# Patient Record
Sex: Male | Born: 2000 | Race: Black or African American | Hispanic: No | Marital: Single | Smoking: Never smoker
Health system: Southern US, Community
[De-identification: ages and names within clinical notes are randomized; demographics above are authoritative.]

## PROBLEM LIST (undated history)

## (undated) HISTORY — PX: ADENOIDECTOMY: SUR15

---
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
## 2020-01-16 ENCOUNTER — Emergency Department (HOSPITAL_COMMUNITY)
Admission: EM | Admit: 2020-01-16 | Discharge: 2020-01-16 | Disposition: A | Discharge status: Home / Self Care | Payer: PRIVATE HEALTH INSURANCE | Attending: Emergency Medicine | Admitting: Emergency Medicine

## 2020-01-16 ENCOUNTER — Other Ambulatory Visit: Payer: Self-pay

## 2020-01-16 ENCOUNTER — Encounter (HOSPITAL_COMMUNITY): Payer: Self-pay | Admitting: Emergency Medicine

## 2020-01-16 DIAGNOSIS — A64 Unspecified sexually transmitted disease: Secondary | ICD-10-CM | POA: Insufficient documentation

## 2020-01-16 DIAGNOSIS — Z7251 High risk heterosexual behavior: Secondary | ICD-10-CM

## 2020-01-16 DIAGNOSIS — R369 Urethral discharge, unspecified: Secondary | ICD-10-CM | POA: Diagnosis present

## 2020-01-16 LAB — URINALYSIS, ROUTINE W REFLEX MICROSCOPIC
Bacteria, UA: NONE SEEN
Bilirubin Urine: NEGATIVE
Glucose, UA: NEGATIVE mg/dL
Hgb urine dipstick: NEGATIVE
Ketones, ur: NEGATIVE mg/dL
Nitrite: NEGATIVE
Protein, ur: 30 mg/dL — AB
Specific Gravity, Urine: 1.028 (ref 1.005–1.030)
WBC, UA: >50 WBC/hpf — ABNORMAL HIGH (ref 0–5)
pH: 6 (ref 5.0–8.0)

## 2020-01-16 LAB — HIV ANTIBODY (ROUTINE TESTING W REFLEX): HIV Screen 4th Generation wRfx: NONREACTIVE

## 2020-01-16 MED ORDER — AZITHROMYCIN 250 MG PO TABS
2000.0000 mg | ORAL_TABLET | Freq: Once | ORAL | Status: AC
  Administered 2020-01-16: 2000 mg via ORAL
  Filled 2020-01-16: qty 8

## 2020-01-16 MED ORDER — GENTAMICIN SULFATE 40 MG/ML IJ SOLN
240.0000 mg | Freq: Once | INTRAMUSCULAR | Status: AC
  Administered 2020-01-16: 240 mg via INTRAMUSCULAR
  Filled 2020-01-16: qty 6

## 2020-01-16 NOTE — Discharge Instructions (Addendum)
Avoid sex for the next 1 week or until symptoms resolve.  Partners need to be treated.

## 2020-01-16 NOTE — ED Provider Notes (Addendum)
[facility] EMERGENCY DEPARTMENT Provider Note   CSN: [id number] Arrival date & time: 01/16/20  1106     History Chief Complaint  Patient presents with  . Penile Discharge    Steven Stanley is a 19 y.o. male.  Patient is an 19 year old male with no significant past medical history presenting today with penile discharge for the last 2 to 3 days. Patient reports that he has some mild dysuria and a yellow discharge. He has not noticed any swelling of his penis or lesions. He has no testicular pain. Patient reports he has been sexually active with 2 partners in the last 1 month and only use protection with one of them. He is unaware if the other partner is having any symptoms. Patient was treated for concern for GC and chlamydia in June of this year with similar symptoms. However his GC chlamydia swab never resulted. Patient does have a significant allergy to cephalosporins and in his last visit received gentamicin and azithromycin. He reports he did very well with this antibiotic regime and had no side effects.  The history is provided by the patient.  Penile Discharge       History reviewed. No pertinent past medical history.  There are no problems to display for this patient.   Past Surgical History:  Procedure Laterality Date  . ADENOIDECTOMY         No family history on file.  Social History   Tobacco Use  . Smoking status: Never Smoker  . Smokeless tobacco: Never Used  Substance Use Topics  . Alcohol use: No  . Drug use: No    Home Medications Prior to Admission medications   Medication Sig Start Date End Date Taking? Authorizing Provider  acetaminophen (TYLENOL) 500 MG tablet Take 500 mg by mouth every 6 (six) hours as needed for fever.    [provider]  azithromycin (ZITHROMAX Z-PAK) 250 MG tablet Take 2 pills today, then 1 pill daily until gone. 08/17/15   Charm Rings, MD    Allergies    Cefdinir and Omni-pac  Review of  Systems   Review of Systems  Genitourinary: Positive for discharge.  All other systems reviewed and are negative.   Physical Exam Updated Vital Signs BP 128/81   Pulse 69   Temp 98.3 F (36.8 C) (Oral)   Resp 17   Ht 6\' 3"  (1.905 m)   Wt 88.5 kg   SpO2 100%   BMI 24.37 kg/m   Physical Exam Vitals and nursing note reviewed.  Constitutional:      Appearance: Normal appearance. He is normal weight.  HENT:     Head: Normocephalic.  Eyes:     Pupils: Pupils are equal, round, and reactive to light.  Cardiovascular:     Rate and Rhythm: Normal rate.     Pulses: Normal pulses.  Pulmonary:     Effort: Pulmonary effort is normal.  Abdominal:     General: Abdomen is flat. There is no distension.     Palpations: Abdomen is soft.     Tenderness: There is no abdominal tenderness. There is no guarding or rebound.  Genitourinary:    Penis: Circumcised. Discharge present. No tenderness, swelling or lesions.      Testes: Normal.     Epididymis:     Right: Normal.     Left: Normal.  Musculoskeletal:        General: Normal range of motion.  Lymphadenopathy:     Lower Body: No  right inguinal adenopathy. No left inguinal adenopathy.  Skin:    General: Skin is warm and dry.     Capillary Refill: Capillary refill takes less than 2 seconds.  Neurological:     General: No focal deficit present.     Mental Status: He is alert and oriented to person, place, and time. Mental status is at baseline.  Psychiatric:        Mood and Affect: Mood normal.        Behavior: Behavior normal.        Thought Content: Thought content normal.      ED Results / Procedures / Treatments   Labs (all labs ordered are listed, but only abnormal results are displayed) Labs Reviewed  URINALYSIS, ROUTINE W REFLEX MICROSCOPIC - Abnormal; Notable for the following components:      Result Value   Color, Urine AMBER (*)    APPearance HAZY (*)    Protein, ur 30 (*)    Leukocytes,Ua SMALL (*)    WBC, UA  >50 (*)    All other components within normal limits  RPR  HIV ANTIBODY (ROUTINE TESTING W REFLEX)  GC/CHLAMYDIA PROBE AMP ([place]) NOT AT [facility]    EKG None  Radiology No results found.  Procedures Procedures (including critical care time)  Medications Ordered in ED Medications  gentamicin (GARAMYCIN) injection 240 mg (has no administration in time range)  azithromycin (ZITHROMAX) tablet 2,000 mg (has no administration in time range)    ED Course  I have reviewed the triage vital signs and the nursing notes.  Pertinent labs & imaging results that were available during my care of the patient were reviewed by me and considered in my medical decision making (see chart for details).    MDM Rules/Calculators/A&P                          Patient presenting with symptoms most consistent with STI for risky sexual practices. Patient is having penile discharge but no other symptoms. There are no lesions concerning for syphilis at this time. No testicular involvement. UA and GC chlamydia ordered. Patient does have a significant penicillin and cephalosporin allergy and in the past when he has been treated he received gentamicin and azithromycin and reports that he did very well with this. We will plan on giving a dose today and checking for HIV and syphilis as well. Otherwise patient is well-appearing and stable for discharge.  12:49 PM UA consistent with uretritis  MDM Number of Diagnoses or Management Options   Amount and/or Complexity of Data Reviewed Clinical lab tests: ordered and reviewed Decide to obtain previous medical records or to obtain history from someone other than the patient: yes Obtain history from someone other than the patient: no Review and summarize past medical records: yes Independent visualization of images, tracings, or specimens: yes  Risk of Complications, Morbidity, and/or Mortality Presenting problems: low Diagnostic procedures: low Management  options: low  Patient Progress Patient progress: stable  Final Clinical Impression(s) / ED Diagnoses Final diagnoses:  Penile discharge  STD (male)  Unprotected sex    Rx / DC Orders ED Discharge Orders    None       Gwyneth Sprout, MD 01/16/20 1149    Gwyneth Sprout, MD 01/16/20 1249

## 2020-01-16 NOTE — ED Triage Notes (Signed)
Pt reports penile discharge x 2 days. Denies pain 

## 2020-01-17 LAB — RPR: RPR Ser Ql: NONREACTIVE

## 2020-03-30 ENCOUNTER — Ambulatory Visit
Admission: EM | Admit: 2020-03-30 | Discharge: 2020-03-30 | Disposition: A | Discharge status: Home / Self Care | Payer: BC Managed Care – PPO | Attending: Emergency Medicine | Admitting: Emergency Medicine

## 2020-03-30 ENCOUNTER — Other Ambulatory Visit: Payer: Self-pay

## 2020-03-30 ENCOUNTER — Encounter: Payer: Self-pay | Admitting: Emergency Medicine

## 2020-03-30 DIAGNOSIS — Z1152 Encounter for screening for COVID-19: Secondary | ICD-10-CM

## 2020-03-30 DIAGNOSIS — B349 Viral infection, unspecified: Secondary | ICD-10-CM | POA: Diagnosis not present

## 2020-03-30 MED ORDER — FLUTICASONE PROPIONATE 50 MCG/ACT NA SUSP: 1.0000 | Freq: Every day | NASAL | 0 refills | Status: AC

## 2020-03-30 NOTE — Discharge Instructions (Signed)
COVID test pending Rest and fluids Flonase for sneezing, congestion Follow up if not improving or worsening

## 2020-03-30 NOTE — ED Triage Notes (Signed)
Pt said x 2days he has been having SOB with no taste or smell. Pt said he was congested but has gone. No fevers, no chills. O2 IS 98%

## 2020-04-01 LAB — NOVEL CORONAVIRUS, NAA: SARS-CoV-2, NAA: NOT DETECTED

## 2020-04-01 LAB — SARS-COV-2, NAA 2 DAY TAT

## 2020-04-02 NOTE — ED Provider Notes (Signed)
[facility]    CSN: [id number] Arrival date & time: 03/30/20  1700      History   Chief Complaint Chief Complaint  Patient presents with  . Shortness of Breath    HPI Steven Stanley is a 20 y.o. male presenting today for evaluation of loss of smell/taste and fatigue.  Reports that symptoms began approximately 2 days ago.  He denies any significant URI symptoms of cough congestion or sore throat.  Has felt slightly short of breath, but this gripes episodes getting winded easily.  Denies any chest discomfort or cough.  HPI  History reviewed. No pertinent past medical history.  There are no problems to display for this patient.   Past Surgical History:  Procedure Laterality Date  . ADENOIDECTOMY         Home Medications    Prior to Admission medications   Medication Sig Start Date End Date Taking? Authorizing Provider  fluticasone (FLONASE) 50 MCG/ACT nasal spray Place 1-2 sprays into both nostrils daily. 03/30/20  Yes Modestine Scherzinger C, PA-C  acetaminophen (TYLENOL) 500 MG tablet Take 500 mg by mouth every 6 (six) hours as needed for fever.    [provider]  azithromycin (ZITHROMAX Z-PAK) 250 MG tablet Take 2 pills today, then 1 pill daily until gone. 08/17/15   Charm Rings, MD    Family History History reviewed. No pertinent family history.  Social History Social History   Tobacco Use  . Smoking status: Never Smoker  . Smokeless tobacco: Never Used  Substance Use Topics  . Alcohol use: No  . Drug use: No     Allergies   Cefdinir and Omni-pac   Review of Systems Review of Systems  Constitutional: Negative for activity change, appetite change, chills, fatigue and fever.  HENT: Positive for congestion, rhinorrhea, sinus pressure and sore throat. Negative for ear pain and trouble swallowing.   Eyes: Negative for discharge and redness.  Respiratory: Positive for cough. Negative for chest tightness and shortness of breath.    Cardiovascular: Negative for chest pain.  Gastrointestinal: Negative for abdominal pain, diarrhea, nausea and vomiting.  Musculoskeletal: Negative for myalgias.  Skin: Negative for rash.  Neurological: Negative for dizziness, light-headedness and headaches.     Physical Exam Triage Vital Signs ED Triage Vitals  Enc Vitals Group     BP 03/30/20 1817 132/77     Pulse Rate 03/30/20 1817 62     Resp 03/30/20 1817 16     Temp 03/30/20 1817 97.6 F (36.4 C)     Temp Source 03/30/20 1817 Oral     SpO2 03/30/20 1817 98 %     Weight 03/30/20 1816 185 lb (83.9 kg)     Height 03/30/20 1816 6\' 3"  (1.905 m)     Head Circumference --      Peak Flow --      Pain Score 03/30/20 1816 0     Pain Loc --      Pain Edu? --      Excl. in GC? --    No data found.  Updated Vital Signs BP 132/77 (BP Location: Right Arm)   Pulse 62   Temp 97.6 F (36.4 C) (Oral)   Resp 16   Ht 6\' 3"  (1.905 m)   Wt 185 lb (83.9 kg)   SpO2 98%   BMI 23.12 kg/m   Visual Acuity Right Eye Distance:   Left Eye Distance:   Bilateral Distance:    Right Eye Near:  Left Eye Near:    Bilateral Near:     Physical Exam Vitals and nursing note reviewed.  Constitutional:      Appearance: He is well-developed and well-nourished.     Comments: No acute distress  HENT:     Head: Normocephalic and atraumatic.     Ears:     Comments: Bilateral ears without tenderness to palpation of external auricle, tragus and mastoid, EAC's without erythema or swelling, TM's with good bony landmarks and cone of light. Non erythematous.     Nose: Nose normal.     Mouth/Throat:     Comments: Oral mucosa pink and moist, no tonsillar enlargement or exudate. Posterior pharynx patent and nonerythematous, no uvula deviation or swelling. Normal phonation.'  Eyes:     Conjunctiva/sclera: Conjunctivae normal.  Cardiovascular:     Rate and Rhythm: Normal rate.  Pulmonary:     Effort: Pulmonary effort is normal. No respiratory  distress.     Comments: Breathing comfortably at rest, CTABL, no wheezing, rales or other adventitious sounds auscultated  Abdominal:     General: There is no distension.  Musculoskeletal:        General: Normal range of motion.     Cervical back: Neck supple.  Skin:    General: Skin is warm and dry.  Neurological:     Mental Status: He is alert and oriented to person, place, and time.  Psychiatric:        Mood and Affect: Mood and affect normal.      UC Treatments / Results  Labs (all labs ordered are listed, but only abnormal results are displayed) Labs Reviewed  NOVEL CORONAVIRUS, NAA   Narrative:    Performed at:  [address] [place], [place]  [id number] Lab Director: Jolene Schimke MD, Phone:  [phone]  SARS-COV-2, NAA 2 DAY TAT   Narrative:    Performed at:  [address]  [address] [place], [place]  [id number] Lab Director: Jolene Schimke MD, Phone:  [phone]    EKG   Radiology No results found.  Procedures Procedures (including critical care time)  Medications Ordered in UC Medications - No data to display  Initial Impression / Assessment and Plan / UC Course  I have reviewed the triage vital signs and the nursing notes.  Pertinent labs & imaging results that were available during my care of the patient were reviewed by me and considered in my medical decision making (see chart for details).     Covid test pending, exam reassuring, suspect viral etiology recommend symptomatic and supportive care rest and fluids.  Discussed strict return precautions. Patient verbalized understanding and is agreeable with plan.  Final Clinical Impressions(s) / UC Diagnoses   Final diagnoses:  Encounter for screening for COVID-19  Viral illness     Discharge Instructions     COVID test pending Rest and fluids Flonase for sneezing, congestion Follow up if not improving or worsening   ED Prescriptions     Medication Sig Dispense Auth. Provider   fluticasone (FLONASE) 50 MCG/ACT nasal spray Place 1-2 sprays into both nostrils daily. 16 g Havannah Streat, [place] C, PA-C     PDMP not reviewed this encounter.   Lew Dawes, [place] 04/02/20 2232

## 2020-10-13 IMAGING — DX DG FINGER THUMB 2+V*R*
3 series · 3 of 3 positions shown · non-contrast
Comparison: Radiograph 02/09/2011

CLINICAL DATA: Thumb injury

EXAM:
RIGHT THUMB 2+V

[finger ap]
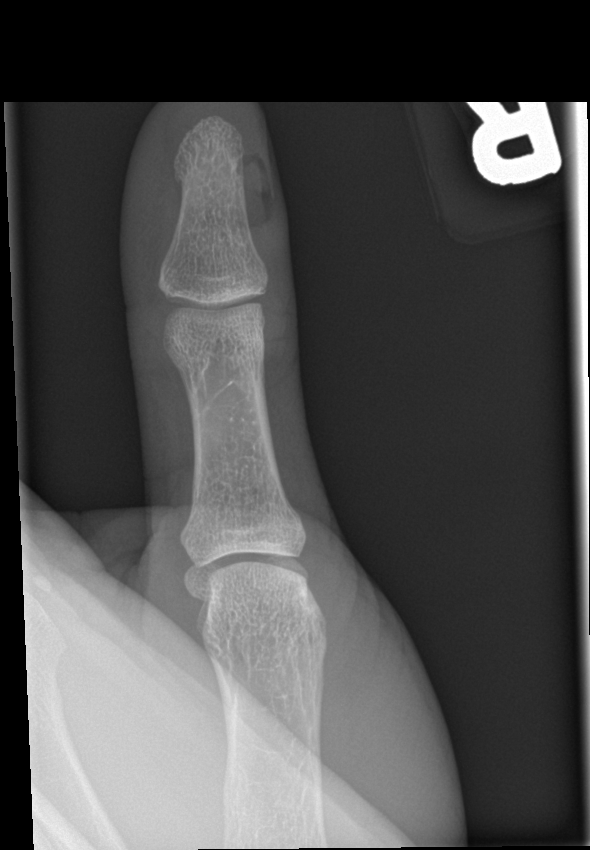

[finger obl]
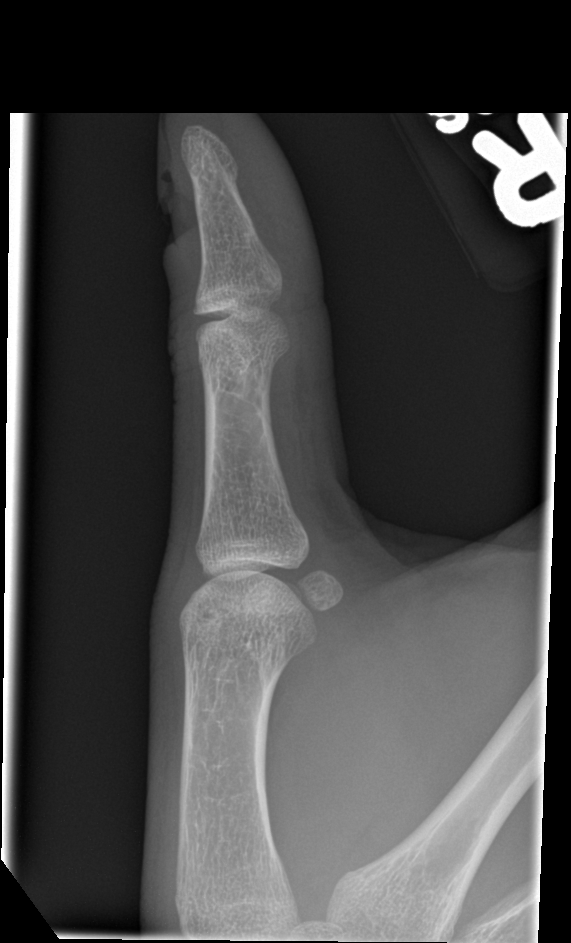

[finger lat]
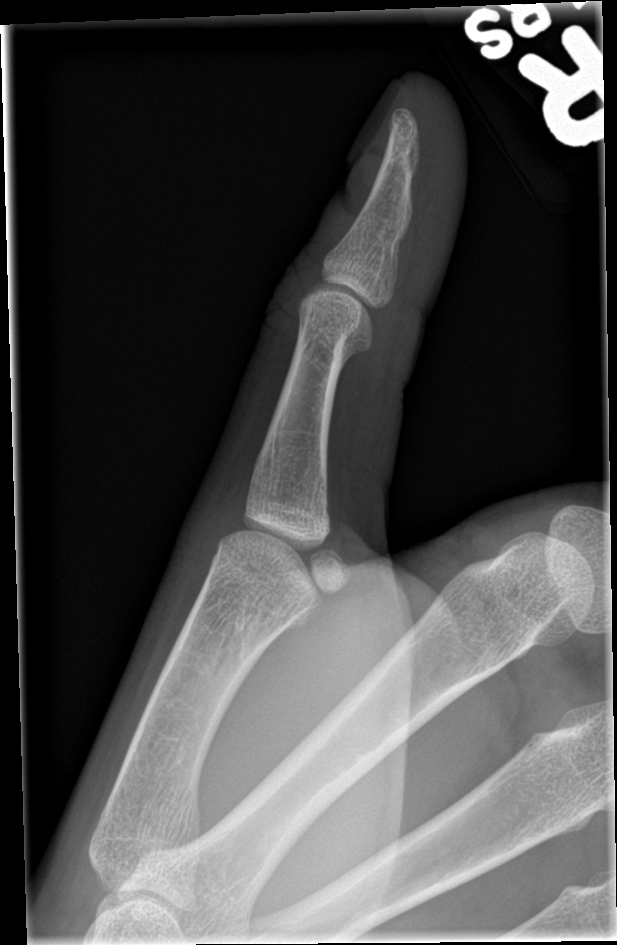

[3 of 3 positions shown; findings below may reference images not displayed]

FINDINGS: There is soft tissue irregularity and disruption of the nail bed of
the first digit. Small amount of gas is seen subjacent to the nail
plate. No subjacent osseous injury or traumatic malalignment is
seen.
IMPRESSION: Soft tissue irregularity and disruption of the nail bed of the first
digit with gas subjacent to the nail plate. Correlate with visual
inspection.

No acute osseous abnormality.

## 2020-10-23 ENCOUNTER — Other Ambulatory Visit: Payer: Self-pay

## 2020-10-23 ENCOUNTER — Encounter (HOSPITAL_COMMUNITY): Payer: Self-pay | Admitting: *Deleted

## 2020-10-23 ENCOUNTER — Emergency Department (HOSPITAL_COMMUNITY)
Admission: EM | Admit: 2020-10-23 | Discharge: 2020-10-23 | Disposition: A | Discharge status: Home / Self Care | Payer: BC Managed Care – PPO | Attending: Emergency Medicine | Admitting: Emergency Medicine

## 2020-10-23 DIAGNOSIS — R369 Urethral discharge, unspecified: Secondary | ICD-10-CM | POA: Insufficient documentation

## 2020-10-23 LAB — URINALYSIS, ROUTINE W REFLEX MICROSCOPIC
Bilirubin Urine: NEGATIVE
Glucose, UA: NEGATIVE mg/dL
Hgb urine dipstick: NEGATIVE
Ketones, ur: 5 mg/dL — AB
Nitrite: NEGATIVE
Protein, ur: 30 mg/dL — AB
Specific Gravity, Urine: 1.026 (ref 1.005–1.030)
WBC, UA: >50 WBC/hpf — ABNORMAL HIGH (ref 0–5)
pH: 6 (ref 5.0–8.0)

## 2020-10-23 MED ORDER — GENTAMICIN SULFATE 40 MG/ML IJ SOLN
240.0000 mg | Freq: Once | INTRAMUSCULAR | Status: AC
  Administered 2020-10-23: 240 mg via INTRAMUSCULAR
  Filled 2020-10-23: qty 6

## 2020-10-23 MED ORDER — DOXYCYCLINE HYCLATE 100 MG PO TABS: 100.0000 mg | ORAL_TABLET | Freq: Two times a day (BID) | ORAL | 0 refills | Status: AC

## 2020-10-23 NOTE — ED Provider Notes (Signed)
[facility]-EMERGENCY DEPT Provider Note   CSN: [id number] Arrival date & time: 10/23/20  1221     History Chief Complaint  Patient presents with   Penile Discharge    Steven Stanley is a 20 y.o. male.  20 y.o male with a PMH of chlamydia presents to the ED with a chief complaint of penile discharge x 2 days. He reports unprotected sexual intercourse about a week ago.  He has been experiencing some white penile discharge, similar symptoms in the past when he had chlamydia.  He does report no urinary symptoms, however does report some tingling when he urinates.  No abdominal pain, fever, no testicular swelling or testicular pain.   The history is provided by the patient.  Penile Discharge This is a new problem. The current episode started 2 days ago. The problem occurs constantly. The problem has not changed since onset.Pertinent negatives include no abdominal pain. Nothing relieves the symptoms. He has tried nothing for the symptoms.      History reviewed. No pertinent past medical history.  There are no problems to display for this patient.   Past Surgical History:  Procedure Laterality Date   ADENOIDECTOMY         No family history on file.  Social History   Tobacco Use   Smoking status: Never   Smokeless tobacco: Never  Substance Use Topics   Alcohol use: No   Drug use: No    Home Medications Prior to Admission medications   Medication Sig Start Date End Date Taking? Authorizing Provider  doxycycline (VIBRA-TABS) 100 MG tablet Take 1 tablet (100 mg total) by mouth 2 (two) times daily for 7 days. 10/23/20 10/30/20 Yes Pasqualino Witherspoon, Leonie Douglas, PA-C  acetaminophen (TYLENOL) 500 MG tablet Take 500 mg by mouth every 6 (six) hours as needed for fever.    [provider]  azithromycin (ZITHROMAX Z-PAK) 250 MG tablet Take 2 pills today, then 1 pill daily until gone. 08/17/15   Charm Rings, MD  fluticasone (FLONASE) 50 MCG/ACT nasal spray Place 1-2  sprays into both nostrils daily. 03/30/20   Wieters, Hallie C, PA-C    Allergies    Cefdinir and Omni-pac  Review of Systems   Review of Systems  Constitutional:  Negative for chills and fever.  Gastrointestinal:  Negative for abdominal pain.  Genitourinary:  Positive for penile discharge. Negative for penile swelling, scrotal swelling and testicular pain.   Physical Exam Updated Vital Signs BP 130/77   Pulse (!) 43   Temp 99.1 F (37.3 C) (Oral)   Resp 18   SpO2 100%   Physical Exam Vitals and nursing note reviewed.  Constitutional:      Appearance: Normal appearance.  HENT:     Head: Normocephalic and atraumatic.     Mouth/Throat:     Mouth: Mucous membranes are moist.  Eyes:     Pupils: Pupils are equal, round, and reactive to light.  Cardiovascular:     Rate and Rhythm: Normal rate.  Pulmonary:     Effort: Pulmonary effort is normal.  Abdominal:     General: Abdomen is flat.     Tenderness: There is no abdominal tenderness.  Musculoskeletal:     Cervical back: Normal range of motion and neck supple.  Skin:    General: Skin is warm and dry.  Neurological:     Mental Status: He is alert and oriented to person, place, and time.    ED Results / Procedures / Treatments  Labs (all labs ordered are listed, but only abnormal results are displayed) Labs Reviewed  URINALYSIS, ROUTINE W REFLEX MICROSCOPIC - Abnormal; Notable for the following components:      Result Value   APPearance CLOUDY (*)    Ketones, ur 5 (*)    Protein, ur 30 (*)    Leukocytes,Ua LARGE (*)    WBC, UA >50 (*)    Bacteria, UA RARE (*)    All other components within normal limits  GC/CHLAMYDIA PROBE AMP ([place]) NOT AT [facility]    EKG None  Radiology No results found.  Procedures Procedures   Medications Ordered in ED Medications  gentamicin (GARAMYCIN) injection 240 mg (has no administration in time range)    ED Course  I have reviewed the triage vital signs and the  nursing notes.  Pertinent labs & imaging results that were available during my care of the patient were reviewed by me and considered in my medical decision making (see chart for details).  Clinical Course as of 10/23/20 1810  Mon Oct 23, 2020  1756 Leukocytes,Ua(!): LARGE [JS]  1756 WBC, UA(!): >50 [JS]    Clinical Course User Index [JS] Claude Manges, PA-C   MDM Rules/Calculators/A&P     Patient presents to the ED with a chief complaint of penile discharge x 1 week.  Prior history of chlamydia, reports similar symptoms symptoms.  No testicular swelling, testicular pain, abdominal pain.  No fever.  He is overall nontoxic, non-ill-appearing, vitals are within normal limits.  Denies any other symptoms.  We discussed treatment with Rocephin along with doxycycline to treat for chlamydia.  Patient does have an allergy to cephalosporins, we opted for gentamicin, after reviewing his chart he has received this in the past for similar symptoms.  He understands and agrees to management today's visit, patient stable for discharge.   Portions of this note were generated with Scientist, clinical (histocompatibility and immunogenetics). Dictation errors may occur despite best attempts at proofreading.  Final Clinical Impression(s) / ED Diagnoses Final diagnoses:  Penile discharge    Rx / DC Orders ED Discharge Orders          Ordered    doxycycline (VIBRA-TABS) 100 MG tablet  2 times daily        10/23/20 1759             Claude Manges, PA-C 10/23/20 1810    Tilden Fossa, MD 10/23/20 [phone]

## 2020-10-23 NOTE — ED Triage Notes (Signed)
Pt complains of white penis discharge x 2 days. Pt states he used condom with sexual intercourse 1 week ago, received oral sex without condom 1 week ago. No pain or dysuria.

## 2020-10-23 NOTE — Discharge Instructions (Signed)
You were treated in the emergency department for gonorrhea with an injection.  The second medication is antibiotic in order to help treat chlamydia.  You will need to take 1 tablet twice a day for the next 7 days.  Please avoid sexual intercourse for 1 week.
